# Patient Record
Sex: Male | Born: 1955 | Race: White | Hispanic: No | Marital: Married | Smoking: Never smoker
Health system: Southern US, Community
[De-identification: ages and names within clinical notes are randomized; demographics above are authoritative.]

## PROBLEM LIST (undated history)

## (undated) DIAGNOSIS — I10 Essential (primary) hypertension: Secondary | ICD-10-CM

## (undated) HISTORY — PX: JOINT REPLACEMENT: SHX530

---

## 2020-04-15 ENCOUNTER — Ambulatory Visit
Admission: EM | Admit: 2020-04-15 | Discharge: 2020-04-15 | Disposition: A | Discharge status: Home / Self Care | Payer: Medicare Other

## 2020-04-15 ENCOUNTER — Other Ambulatory Visit: Payer: Self-pay

## 2020-04-15 ENCOUNTER — Encounter: Payer: Self-pay | Admitting: Emergency Medicine

## 2020-04-15 ENCOUNTER — Ambulatory Visit (INDEPENDENT_AMBULATORY_CARE_PROVIDER_SITE_OTHER): Payer: Medicare Other

## 2020-04-15 ENCOUNTER — Ambulatory Visit: Admit: 2020-04-15 | Discharge status: Against Medical Advice | Payer: Self-pay

## 2020-04-15 DIAGNOSIS — U071 COVID-19: Secondary | ICD-10-CM | POA: Diagnosis not present

## 2020-04-15 DIAGNOSIS — R059 Cough, unspecified: Secondary | ICD-10-CM

## 2020-04-15 DIAGNOSIS — Z8616 Personal history of COVID-19: Secondary | ICD-10-CM | POA: Diagnosis not present

## 2020-04-15 HISTORY — DX: Essential (primary) hypertension: I10

## 2020-04-15 MED ORDER — BENZONATATE 100 MG PO CAPS: 200.0000 mg | ORAL_CAPSULE | Freq: Three times a day (TID) | ORAL | 0 refills | Status: AC | PRN

## 2020-04-15 NOTE — ED Provider Notes (Signed)
[facility]    CSN: [id number] Arrival date & time: 04/15/20  0851      History   Chief Complaint Chief Complaint  Patient presents with  . Cough    HPI Brandon Haas is a 65 y.o. male who tested positive for COVID-19 on 04/04/2020.  Patient states that his symptoms started a couple of days before that he states he had multiple positive Covid tests including at home test and one through [facility]  Patient states that symptom started with sore throat and nasal congestion as well as postnasal drainage and cough.  He says that his symptoms have not gotten worse and most of his symptoms have improved except as dry cough lingers.  He denies any fever, chest pain, breathing difficulty.  Denies any fatigue or weakness.  Patient states that he feels well but was just concerned because the cough has continued.  Patient denies any history of cardiopulmonary disease.  Past medical history is significant for hypertension, but no other medical conditions.  Patient not taking any over-the-counter medications.  He does have an allergy to dextromethorphan.  No other complaints or concerns.  HPI  Past Medical History:  Diagnosis Date  . Hypertension     There are no problems to display for this patient.   Past Surgical History:  Procedure Laterality Date  . JOINT REPLACEMENT         Home Medications    Prior to Admission medications   Medication Sig Start Date End Date Taking? Authorizing Provider  benzonatate (TESSALON) 100 MG capsule Take 2 capsules (200 mg total) by mouth 3 (three) times daily as needed for cough. 04/15/20  Yes Shirlee Latch, PA-C  testosterone (ANDROGEL) 50 MG/5GM (1%) GEL Place 5 g onto the skin daily.   Yes [provider]    Family History History reviewed. No pertinent family history.  Social History Social History   Tobacco Use  . Smoking status: Never Smoker  . Smokeless tobacco: Never Used  Substance Use Topics  . Alcohol use:  Yes    Comment: daily beer  . Drug use: Never     Allergies   Dextromethorphan   Review of Systems Review of Systems  Constitutional: Negative for fatigue and fever.  HENT: Positive for postnasal drip. Negative for congestion, rhinorrhea, sinus pressure, sinus pain and sore throat.   Respiratory: Positive for cough. Negative for shortness of breath.   Gastrointestinal: Negative for abdominal pain, diarrhea, nausea and vomiting.  Musculoskeletal: Negative for myalgias.  Neurological: Negative for weakness, light-headedness and headaches.  Hematological: Negative for adenopathy.     Physical Exam Triage Vital Signs ED Triage Vitals  Enc Vitals Group     BP 04/15/20 0909 (!) 142/83     Pulse Rate 04/15/20 0909 86     Resp 04/15/20 0909 20     Temp 04/15/20 0909 98.1 F (36.7 C)     Temp Source 04/15/20 0909 Oral     SpO2 04/15/20 0909 100 %     Weight --      Height --      Head Circumference --      Peak Flow --      Pain Score 04/15/20 0901 0     Pain Loc --      Pain Edu? --      Excl. in GC? --    No data found.  Updated Vital Signs BP (!) 142/83 (BP Location: Left Arm)   Pulse 86  Temp 98.1 F (36.7 C) (Oral)   Resp 20   SpO2 100%       Physical Exam Vitals and nursing note reviewed.  Constitutional:      General: He is not in acute distress.    Appearance: He is well-developed and well-nourished. He is not diaphoretic.  HENT:     Head: Normocephalic and atraumatic.     Nose: Nose normal.     Mouth/Throat:     Mouth: Oropharynx is clear and moist and mucous membranes are normal.     Pharynx: Uvula midline. No oropharyngeal exudate.     Tonsils: No tonsillar abscesses.  Eyes:     General: No scleral icterus.       Right eye: No discharge.        Left eye: No discharge.     Extraocular Movements: EOM normal.     Conjunctiva/sclera: Conjunctivae normal.  Neck:     Thyroid: No thyromegaly.     Trachea: No tracheal deviation.  Cardiovascular:      Rate and Rhythm: Normal rate and regular rhythm.     Heart sounds: Normal heart sounds.  Pulmonary:     Effort: Pulmonary effort is normal. No respiratory distress.     Breath sounds: Normal breath sounds. No wheezing or rales.  Chest:     Chest wall: No tenderness.  Musculoskeletal:     Cervical back: Normal range of motion and neck supple.  Lymphadenopathy:     Cervical: No cervical adenopathy.  Skin:    General: Skin is warm and dry.     Findings: No rash.  Neurological:     General: No focal deficit present.     Mental Status: He is alert. Mental status is at baseline.     Motor: No weakness.     Gait: Gait normal.  Psychiatric:        Mood and Affect: Mood normal.        Behavior: Behavior normal.        Thought Content: Thought content normal.      UC Treatments / Results  Labs (all labs ordered are listed, but only abnormal results are displayed) Labs Reviewed - No data to display  EKG   Radiology DG Chest 2 View  Result Date: 04/15/2020 CLINICAL DATA:  Cough, COVID diagnosis on 12/29. EXAM: CHEST - 2 VIEW COMPARISON:  None. FINDINGS: Heart size and mediastinal contours are within normal limits. Lungs are clear. No pleural effusion. Osseous structures about the chest are unremarkable. IMPRESSION: No active cardiopulmonary disease. No evidence of pneumonia. Electronically Signed   By: Bary Richard M.D.   On: 04/15/2020 09:42    Procedures Procedures (including critical care time)  Medications Ordered in UC Medications - No data to display  Initial Impression / Assessment and Plan / UC Course  I have reviewed the triage vital signs and the nursing notes.  Pertinent labs & imaging results that were available during my care of the patient were reviewed by me and considered in my medical decision making (see chart for details).   VSS and patient in NAD.  Oxygen 100%.  He is afebrile.  Auscultation of chest is within normal limits.  Chest x-ray obtained due  to patient concerns.  Chest x-ray is normal.  Discussed results with patient.  Advised him that the cough can linger.  Sent Tessalon Perles for him to take and advised increased rest and fluids.  Advised to follow-up as needed for any new or  worsening symptoms including fever, productive cough, chest pain or breathing difficulty.  Patient agreeable.   Final Clinical Impressions(s) / UC Diagnoses   Final diagnoses:  Cough  Personal history of COVID-19     Discharge Instructions     X-ray normal.  Cough can linger.  Take the cough medication as prescribed increase your fluid intake.  Also consider use of throat lozenges.  Follow-up if you develop worsening symptoms including fever, productive cough, chest pain or breathing difficulty.    ED Prescriptions    Medication Sig Dispense Auth. Provider   benzonatate (TESSALON) 100 MG capsule Take 2 capsules (200 mg total) by mouth 3 (three) times daily as needed for cough. 21 capsule Shirlee Latch, PA-C     PDMP not reviewed this encounter.   Eusebio Friendly B, PA-C 04/15/20 1000

## 2020-04-15 NOTE — Discharge Instructions (Signed)
X-ray normal.  Cough can linger.  Take the cough medication as prescribed increase your fluid intake.  Also consider use of throat lozenges.  Follow-up if you develop worsening symptoms including fever, productive cough, chest pain or breathing difficulty.

## 2020-04-15 NOTE — ED Triage Notes (Signed)
Pt reports testing positive for Covid around 12/29th and complains of continued cough. Denies fever.

## 2022-07-31 IMAGING — CR DG CHEST 2V
3 series · 3 of 3 positions shown · non-contrast
Comparison: None.

CLINICAL DATA: Cough, COVID diagnosis on [DATE].

EXAM:
CHEST - 2 VIEW

[chest pa (1 of 2)]
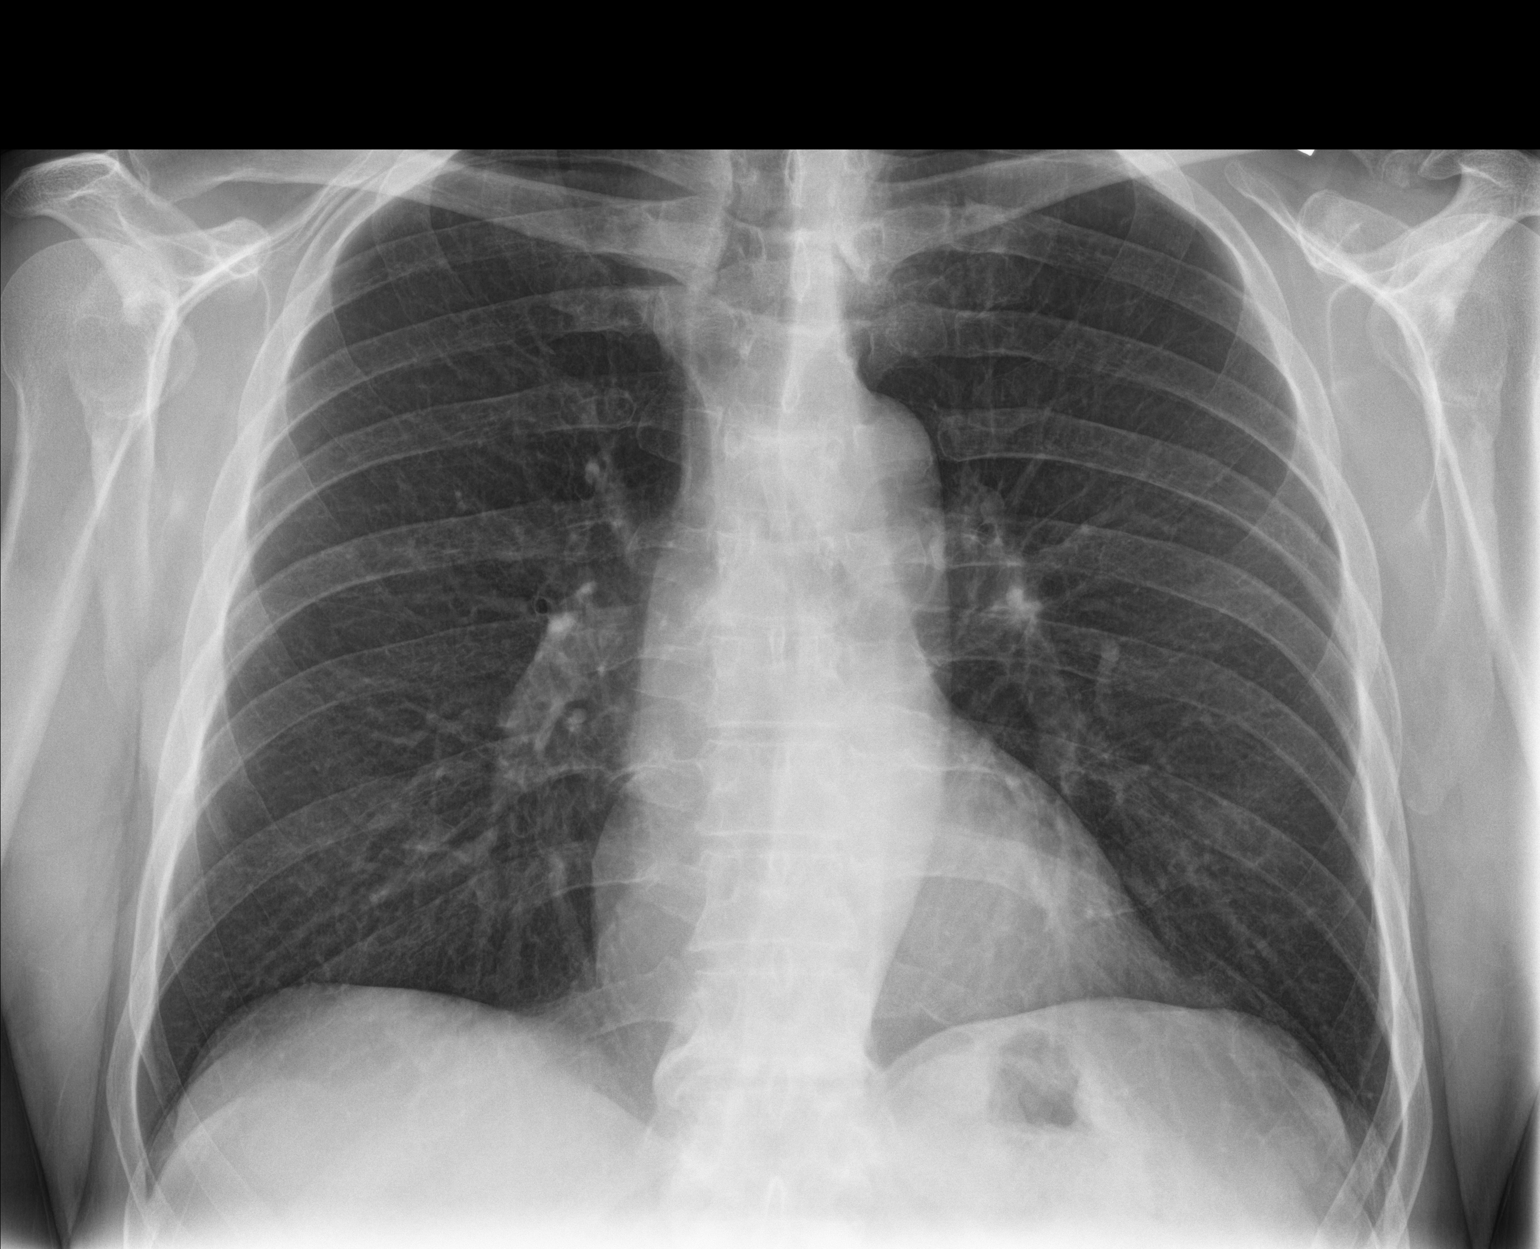

[chest lat]
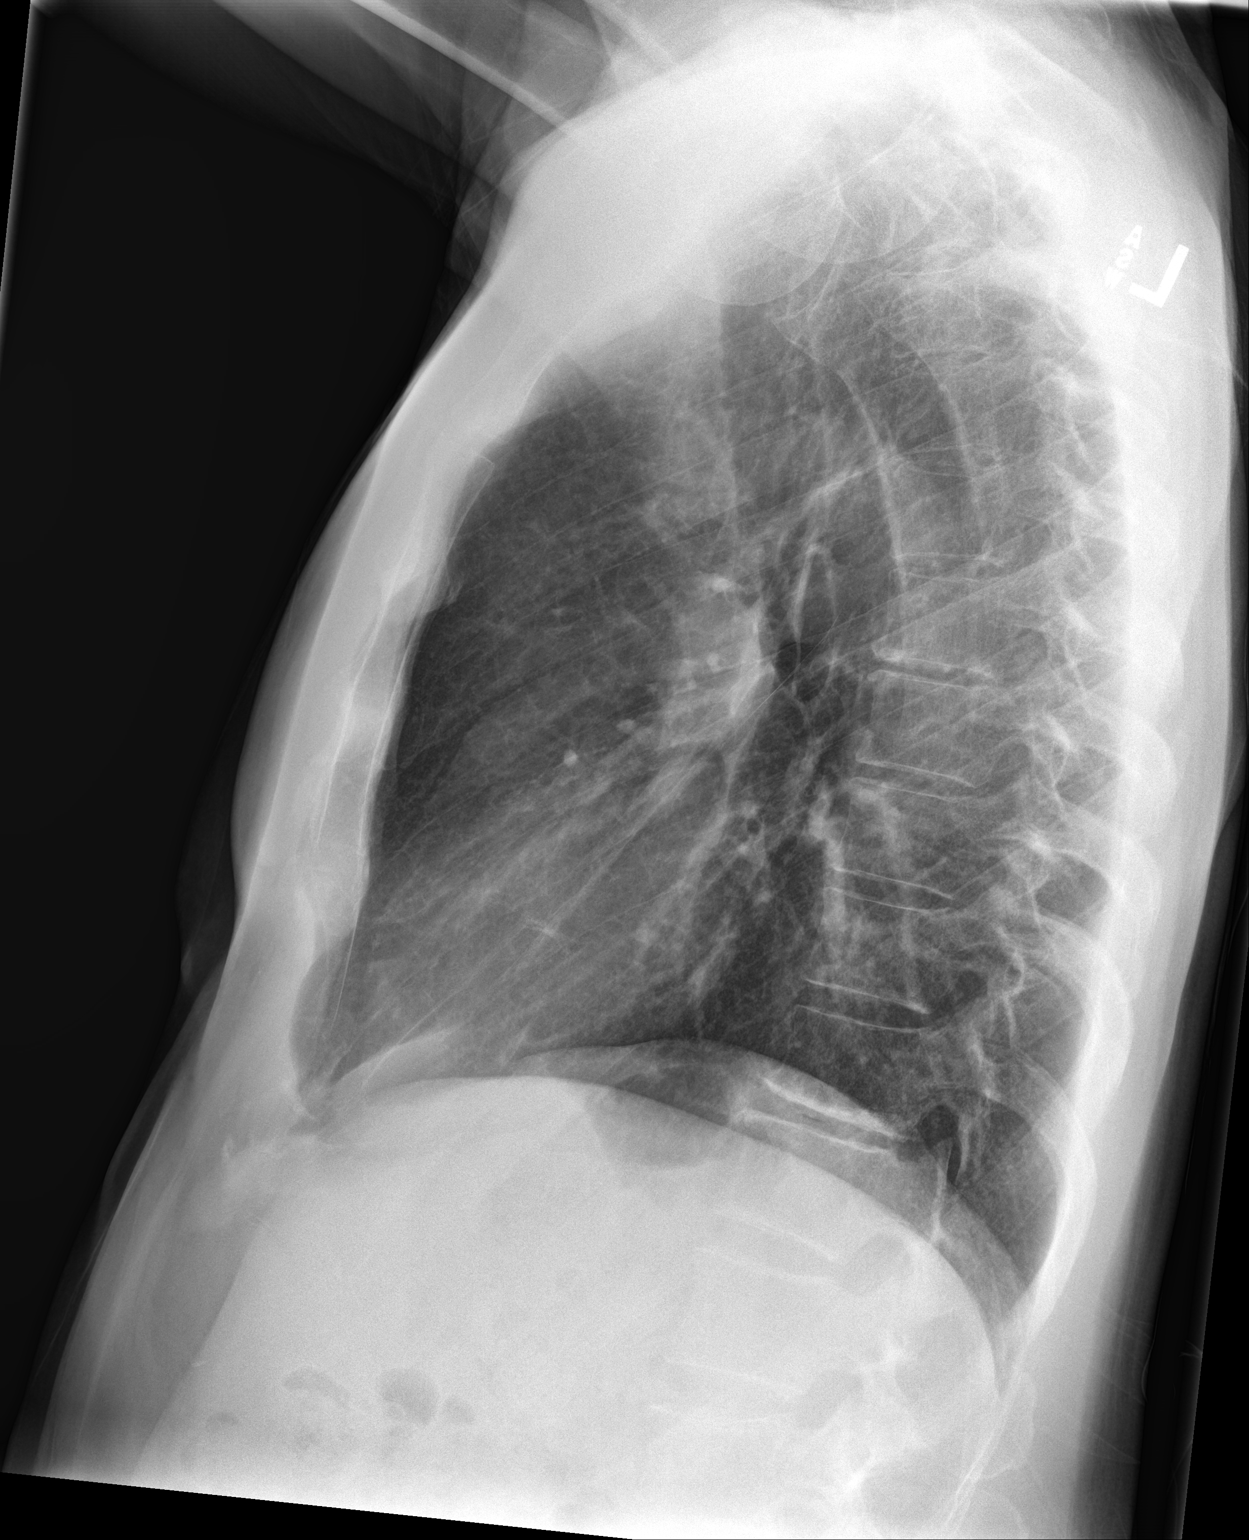

[chest pa (2 of 2)]
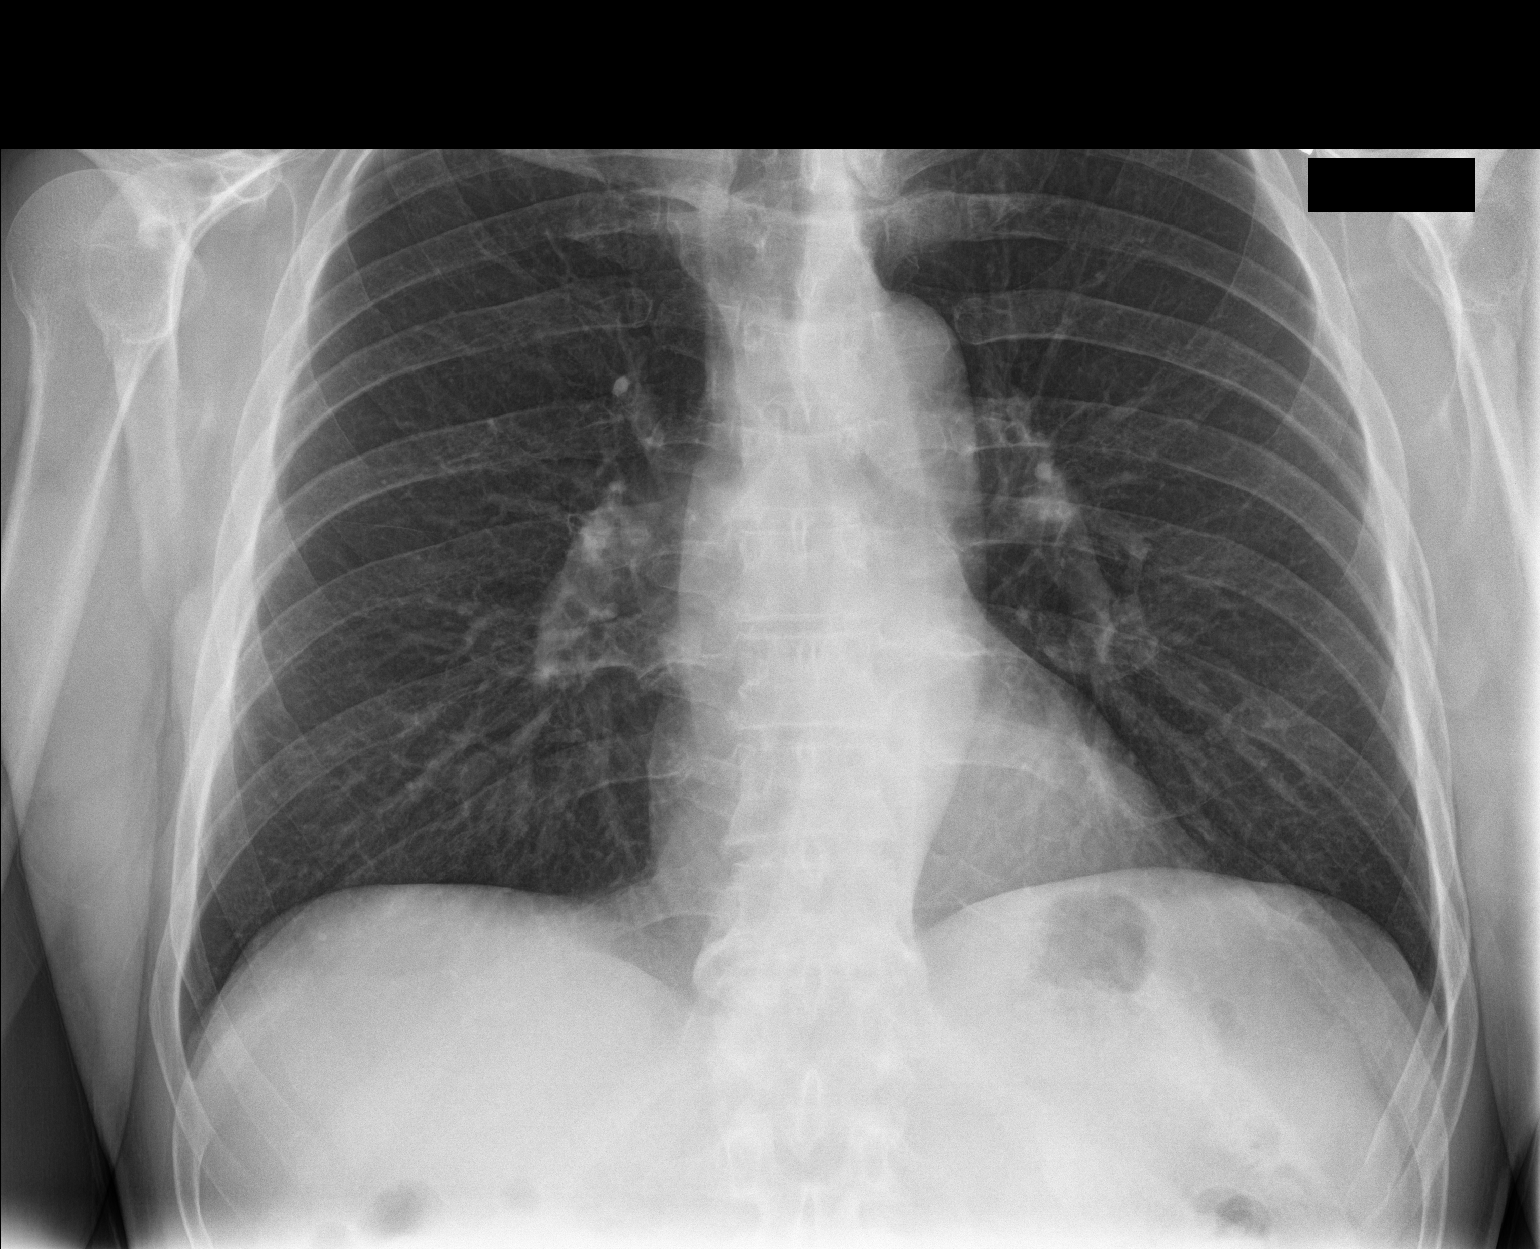

[3 of 3 positions shown; findings below may reference images not displayed]

FINDINGS: Heart size and mediastinal contours are within normal limits. Lungs
are clear. No pleural effusion. Osseous structures about the chest
are unremarkable.
IMPRESSION: No active cardiopulmonary disease. No evidence of pneumonia.
